# Patient Record
Sex: Female | Born: 1970 | Race: White | Hispanic: No | Marital: Single | Smoking: Current every day smoker
Health system: Southern US, Community
[De-identification: ages and names within clinical notes are randomized; demographics above are authoritative.]

## PROBLEM LIST (undated history)

## (undated) DIAGNOSIS — D689 Coagulation defect, unspecified: Secondary | ICD-10-CM

## (undated) DIAGNOSIS — I2699 Other pulmonary embolism without acute cor pulmonale: Secondary | ICD-10-CM

---

## 1998-12-23 ENCOUNTER — Emergency Department (HOSPITAL_COMMUNITY): Admission: EM | Admit: 1998-12-23 | Discharge: 1998-12-23 | Payer: Self-pay | Admitting: Emergency Medicine

## 2000-01-25 ENCOUNTER — Emergency Department (HOSPITAL_COMMUNITY): Admission: EM | Admit: 2000-01-25 | Discharge: 2000-01-25 | Payer: Self-pay | Admitting: Emergency Medicine

## 2002-10-14 ENCOUNTER — Encounter: Payer: Self-pay | Admitting: Emergency Medicine

## 2002-10-14 ENCOUNTER — Emergency Department (HOSPITAL_COMMUNITY): Admission: AD | Admit: 2002-10-14 | Discharge: 2002-10-14 | Payer: Self-pay | Admitting: Emergency Medicine

## 2004-01-19 ENCOUNTER — Emergency Department (HOSPITAL_COMMUNITY): Admission: EM | Admit: 2004-01-19 | Discharge: 2004-01-19 | Payer: Self-pay | Admitting: Emergency Medicine

## 2004-05-15 ENCOUNTER — Encounter
Admission: RE | Admit: 2004-05-15 | Discharge: 2004-05-15 | Payer: Self-pay | Admitting: Physical Medicine and Rehabilitation

## 2004-08-18 ENCOUNTER — Emergency Department (HOSPITAL_COMMUNITY): Admission: EM | Admit: 2004-08-18 | Discharge: 2004-08-18 | Payer: Self-pay | Admitting: Emergency Medicine

## 2006-08-30 ENCOUNTER — Inpatient Hospital Stay (HOSPITAL_COMMUNITY): Admission: EM | Admit: 2006-08-30 | Discharge: 2006-09-01 | Payer: Self-pay | Admitting: Emergency Medicine

## 2006-08-30 ENCOUNTER — Ambulatory Visit: Payer: Self-pay | Admitting: Cardiology

## 2006-08-31 ENCOUNTER — Encounter: Payer: Self-pay | Admitting: Cardiology

## 2006-10-29 ENCOUNTER — Emergency Department (HOSPITAL_COMMUNITY): Admission: EM | Admit: 2006-10-29 | Discharge: 2006-10-29 | Payer: Self-pay | Admitting: Emergency Medicine

## 2010-04-30 ENCOUNTER — Ambulatory Visit: Payer: Self-pay | Admitting: Internal Medicine

## 2010-05-29 ENCOUNTER — Inpatient Hospital Stay: Payer: Self-pay | Admitting: Internal Medicine

## 2010-05-31 ENCOUNTER — Ambulatory Visit: Payer: Self-pay | Admitting: Internal Medicine

## 2010-06-08 LAB — PATHOLOGY REPORT

## 2010-07-01 ENCOUNTER — Ambulatory Visit: Payer: Self-pay | Admitting: Internal Medicine

## 2010-07-24 ENCOUNTER — Ambulatory Visit: Payer: Self-pay | Admitting: Surgery

## 2010-10-16 NOTE — H&P (Signed)
NAMENERY, KALISZ NO.:  [id number]   MEDICAL RECORD NO.:  [id number]          PATIENT TYPE:  EMS   LOCATION:  MAJO                         FACILITY:  [facility]   PHYSICIAN:  Elliot Cousin, M.D.    DATE OF BIRTH:  [birth year]   DATE OF ADMISSION:  08/30/2006  DATE OF DISCHARGE:                              HISTORY & PHYSICAL   PRIMARY CARE PHYSICIAN:  Gentry Fitz (receives health care at [facility] in [place], [place]).   CHIEF COMPLAINT:  Chest pressure.   HISTORY OF PRESENT ILLNESS:  The patient is a 40 year old woman with a  past medical history significant for obesity, hypertension, and cervical  cancer, who presents to the emergency department with a chief complaint  of chest pain. The patient is an LPN. While she was at work today at  approximately 3 p.m., she experienced left sided chest pain.  She was  sitting with a patient at the time. The pain is described as a  pressure.  The pressure is sometimes associated with a fluttering  feeling in her chest. At the time that the pain occurred, she rated it  a 6/10 in intensity. It was associated with transient shortness of  breath, mild diaphoresis, and light headedness.  She had no radiation of  the pressure and no associated nausea. She asked one of her colleagues  at work to check her blood pressure. Her blood pressure was recorded as  200/150.  It was repeated several minutes later at 187/118. The patient  is chronically treated with Micardis-HCT for hypertension. She has had  mild chest pain in the recent past which generally lasts for  approximately a minute or two and then goes away.  At times in the past,  she has had some radiation to the left neck. 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   During the evaluation in the emergency department, the patient is found  to be mildly hypertensive, otherwise hemodynamically  stable. Her blood  pressure on arrival to the emergency department was 175/67 and it is now  133/47. The patient is currently chest pain free after approximately 45  minutes of chest pressure.  She received 4 baby aspirin in the emergency  department prior to the complete resolution of her chest pain.  Her EKG  reveals a heart rate of 92 beats per minute and no significant  abnormalities.  Her cardiac markers are negative. Her D-dimer is within  normal limits at 0.04. The patient has a significant family history of  coronary artery disease and will therefore be admitted for further  evaluation and management.   PAST MEDICAL HISTORY:  1. Hypertension.  2. Gastroesophageal reflux disease.  3. History of cervical cancer status post conization in 1995.  4. Abnormal menstrual periods. The patient states that she has a      menstrual period every 3-4 months and when she has a menstrual      period it usually lasts approximately 25 days. She has an      appointment pending with her gynecologist.  5. Fatty liver per ultrasound of the abdomen in March of 2008 in      [place], [place]. The ultrasound was obtained for evaluation of      abdominal pain.  6. Obesity.  7. 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  8. Status post tonsillectomy in the past.   MEDICATIONS:  1. Micardis-HCT 40/12.5 mg daily.  2. Prilosec 20 mg daily.  3. Unisom 1 tablet p.r.n. for insomnia.  4. Tylenol 325 mg p.r.n.  5. Motrin 200-400 mg p.r.n.   ALLERGIES:  FLUORIDE, HYDROCODONE, AND SEPTRA.   SOCIAL HISTORY:  The patient is single. She has one child. She is  employed as an Public house manager. She lives in [place], [place]. She denies tobacco  use. She quite smoking 3 years ago after smoking nearly 25 years. She  drinks alcohol on occasion.  No illicit drugs.   FAMILY HISTORY:  Her mother is 87-years of age and had a heart attack at  83 and 22 years of age.  Her mother also has a history of two strokes.  Her  mother had ovarian cancer at 71 years of age. The health of her  father is unknown.  She has a 42 year old sister who is healthy (her  sister has a different father).   REVIEW OF SYSTEMS:  The patient's review of systems is positive for  occasional fluttering in her chest, occasional chest pain, occasional  shortness of breath, neck pain, heart burn.  Otherwise review of systems  is negative.   PHYSICAL EXAMINATION:  VITAL SIGNS: Temperature 98.6, blood pressure  133/47, pulse 78, respiratory rate 20, oxygen saturation 99% on room  air.  GENERAL:  The patient is a pleasant, obese, 40 year old Caucasian woman  who is currently sitting up in bed in no acute distress.  HEENT:  Head is normocephalic, atraumatic.  Pupils are equal, round,  reactive to light. Extraocular movements are intact. Conjunctivae are  clear, sclerae are white. Nasal mucosa is dry. No sinus tenderness.  Oropharynx reveals good dentition. Mucous membranes are moist. No  posterior exudates or erythema.  NECK:  Supple, no adenopathy, no thyromegaly, no bruit, no JVD. No  tenderness.  HEART:  S1, S2 with a 2/6 systolic murmur.  LUNGS:  Clear to auscultation bilaterally.  ABDOMEN:  Obese, positive bowel sounds, soft, nontender, nondistended.  No hepatosplenomegaly, no masses palpated.  EXTREMITIES:  Pedal pulses 2+ bilaterally. No pretibial edema and no  pedal edema.  NEUROLOGIC:  The patient is alert and oriented x3. Cranial nerves II-XII  are intact. Strength is 5/5 throughout. Sensation is intact.  SKIN:  The patient has multiple tattoos, one on each arm, two on her  chest and one on each leg.   ADMISSION LABORATORIES:  Chest x-ray reveals no acute findings.  Myoglobin 37.9. CK-MB 1.0. Troponin I less than 0.05. WBC 8.2 thousand,  hemoglobin 14.4, hematocrit 42.1, platelets 221,000.  Sodium 136,  potassium 3.8, chloride 108, BUN 14, glucose 133, bicarbonate 22, creatinine 0.7. D-dimer 0.4.   ASSESSMENT:  1.  Chest pain. Given the patient's significant family history and her      personal history of hypertension and obesity, myocardial infarction      will need to be ruled out.  The patient will also be evaluated for      mitral valve prolapse and thyroid disease.  2. Hyperglycemia. The patient's glucose is 133. The glucose was non      fasting. However the patient  is at risk for developing type  2      diabetes mellitus.  3. Hypertension. The patient's blood pressure is now within normal      limits. She is chronically treated with Micardis-HCT.   PLAN:  1. The patient will be admitted for further evaluation and management.  2. Will check cardiac enzymes q.8 hours x3.  3. For further evaluation will check a 2D echocardiogram  to evaluate      for mitral valve prolapse.  4. Will also assess the patient's TSH and fasting lipid panel.  5. Will check a hemoglobin A1C.  6. Nutrition consult for recommendations regarding weight loss.  7. Supportive care and pain management. Will start a baby aspirin      daily for now.      Elliot Cousin, M.D.  Electronically Signed     DF/MEDQ  D:  08/30/2006  T:  08/30/2006  Job:  [id number]

## 2010-10-16 NOTE — Consult Note (Signed)
NAMEEKAM, BONEBRAKE          ACCOUNT NO.:  [id number]   MEDICAL RECORD NO.:  [id number]          PATIENT TYPE:  INP   LOCATION:  3707                         FACILITY:  [facility]   PHYSICIAN:  Bevelyn Buckles. Bensimhon, MDDATE OF BIRTH:  July 23, 1970   DATE OF CONSULTATION:  08/31/2006  DATE OF DISCHARGE:                                 CONSULTATION   REFERRING PHYSICIAN:  [facility].   PRIMARY CARE PHYSICIAN:  [facility].   SUMMARY OF HISTORY:  Ms. Ohagan is a 40 year old white female who was  admitted by [facility] through [facility] emergency room when she  presented with chest discomfort via private vehicle.  We are asked to  evaluate given her multiple cardiac risk factors and her discomfort.   Ms. Isakson describes anterior chest pressure since January 2008.  The  discomfort does not radiate.  She states that this occurs two to three  times a month, the duration of the discomfort is less than 5 minutes.  It does not radiate, nor is it associated with palpitations, nausea or  vomiting.  She has associated fluttering and some shortness of breath.  She is not sure about diaphoresis because she states that she has hot  flashes all the time.  With these episodes she will relax and try to  meditate.  She is not sure if this helps or not but the discomfort is  gone within 5 minutes.  She is not aware of any alleviating or  aggravating factors.  She has never checked her blood pressure during  these episodes.   At work yesterday afternoon at approximately 3:15 p.m. she stated that  she felt terrible.  She first developed a headache, noticed a heart beat  in her ears followed by shortness of breath and chest pressure  fluttering sensation which she gave a 6 on a scale of 0/10. Her blood  pressure she took was 200/150.  She recheck this and it was 220/140.  She rechecked this again and it was 181/118 with a pulse of 94.  Because  of her  symptoms and her hypertension she presented to the emergency room  for evaluation.  Since admission she has continued to have some  intermittent chest pressure.   ALLERGIES:  Include FLUORIDE, HYDROCODONE and SEPTRA.   MEDICATIONS PRIOR TO ADMISSION:  Include Micardis/HCT 40/12.5 every day,  Prilosec 20 daily,  Unisom p.r.n., Tylenol p.r.n., Motrin 200 to 400 mg  p.r.n.   PAST MEDICAL HISTORY:  Notable for obesity, hypertension of unspecified  duration.  Her blood pressure at home she states usually runs from the  140s over 70s to 80s.  She also has a history of cervical cancer status  post conization in 1995, irregular menses which occur every 3-4 months  lasting 25 days.  She has DJD in the cervical spine, fatty liver  determined by ultrasound in 3/08, unipolar disorder with anxiety since  1997, status post tonsillectomy and adenoidectomy, status post wisdom  teeth removal in 1994.   SOCIAL HISTORY:  She resides in [place] with her 34 year old son  and her  59 year old mother.  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  She is single.  She has not smoked in 3 years. Prior to that she smoked one and a half  packs per day for 25 years.  She may have a beer one to two times per  month, denies any illicit drugs.  She stopped using flaxseed oil  approximately 3 months ago.  She states that she maintains a low-fat  diet and avoids processed food, however she tends to binge on sweets.  She does not exercise.   FAMILY HISTORY:  Mother is age 12 with a history of myocardial  infarction at the age of 39 and 50, a history of strokes x2, ovarian  cancer at the age of 42.  She does not know her father's health.  She  has a half sister that is alive and well.   REVIEW OF SYSTEMS:  In addition to above is notable for coughing,  wheezing last week associated with a temperature of 101.8 of which she  did not seek evaluation.   Chronic hot flashes.  A 25-pound weight gain  over the last year.  Occasional headaches, glasses, occasional dyspnea  on exertion.  Last menstruation cycle was from 06/11/2006 to 07/02/2006.  She has chronic anxiety, neck arthralgias and in her left shoulder.  GERD symptoms.  She does snore.  Her primary care physician is  recommending a sleep study and she also has an appointment to see her  OB/GYN.   PHYSICAL EXAM:  GENERAL:  Well-nourished, well-developed pleasant obese  white female in no apparent distress.  VITAL SIGNS:  Temperature is 97.9, blood pressure is 127/73, pulse 70  regular, respirations 18 and regular, T max of 98.7.  Telemetry shows  normal sinus rhythm without ectopy.  Admission weight 131 kg.  Sat is  99% on room air.  HEENT:  Is unremarkable.  NECK:  Supple without thyromegaly, adenopathy, JVD or carotid bruits.  CHEST:  Symmetrical excursion.  LUNGS:  Lungs were clear to auscultation without rales, rhonchi or  wheezing.  HEART:  PMI is not displaced.  Regular rate and rhythm.  Normal S1 and  S2.  I did not appreciate murmurs, rubs, clicks or gallops.  All pulses  are symmetrical and intact without abdominal or femoral bruits.  SKIN:  Integument is intact.  She has multiple tattoos and dark velvety  skin over her joint folds.  ABDOMEN:  Obese.  Bowel sounds present without organomegaly, masses or  tenderness.  EXTREMITIES:  Negative cyanosis, clubbing or edema.  MUSCULOSKELETAL AND NEUROLOGIC:  Grossly unremarkable.   Chest x-ray showed no active disease.  EKG x2 showed normal sinus  rhythm, normal axis, normal intervals, early R-wave progression.  No old  EKGs for comparison.  On admission H&H was 15.0 and 44.0.  Normal  indices.  Platelets 221, WBCs 8.2.  CK MBs and troponins are within  normal limits x2.  D-dimer 0.4, hemoglobin A1c 5.6.  Today a C-met  showed sodium 142, potassium 3.5, BUN 12, creatinine 0.63, glucose 100, ALT was slightly elevated at 42.   Fasting lipids showed a cholesterol  192, triglycerides 132, HDL 29, LDL 137.   IMPRESSION:  Atypical chest discomfort, hypertension, multiple cardiac  risk factors including hypertension, obesity, hyperlipidemia, early  family history, remote tobacco use.   DISPOSITION:  Dr. Gala Romney reviewed the patient's history, spoke with  and examined the patient and agrees with the above.  He felt that her  discomfort was probably not cardiac given her normal EKGs, history and  negative cardiac enzymes.  It was felt that she would be low risk.  It  was felt that she could be discharged today if her echocardiogram was  normal.  However, we will set her up for an outpatient Myoview to  further evaluate her multiple cardiac risk factors and would add Coreg  6.25 mg b.i.d. to her medical regimen for her hypertension.  Would consider outpatient 24-hour urine to rule out pheochromocytoma  checking her urine catecholamines, VMA and metanephrine.  She should  also be referred to a weight-loss program as well as obtain and  establish a blood pressure diary and follow up with her primary care  physician.      Joellyn Rued, PA-C      Bevelyn Buckles. Bensimhon, MD  Electronically Signed    EW/MEDQ  D:  08/31/2006  T:  08/31/2006  Job:  [id number]   cc:   [facility] R. Bensimhon, MD

## 2011-08-19 IMAGING — CT NM PET TUM IMG LTD AREA
1 of 5 series · 8 of 25 positions shown · non-contrast
Comparison: none

REASON FOR EXAM: SPLENIC MASSES AND ABDOMINAL PAIN
COMMENTS:

[Series 3: ct wb 3.0 b30f · axial · 3.0mm · 0.98mm/px · z∈[-1263,-589]mm · 8 of 435 slices shown]
[im 49/435  soft-tissue]
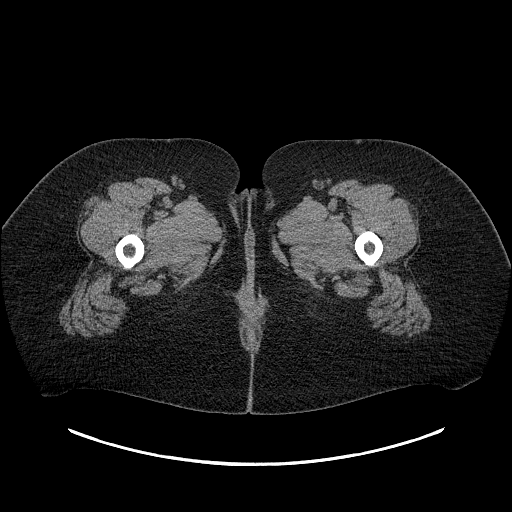
[im 97/435  soft-tissue]
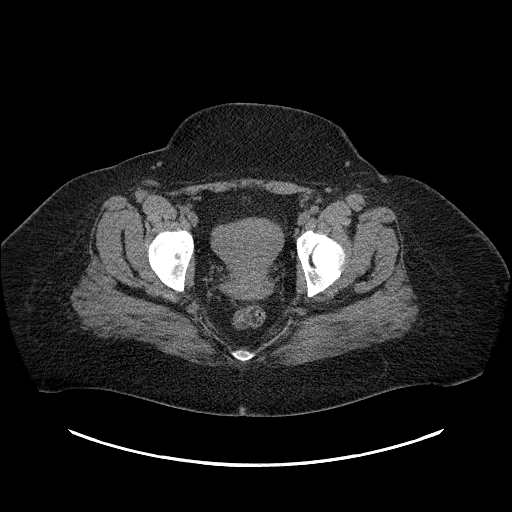
[im 145/435  soft-tissue]
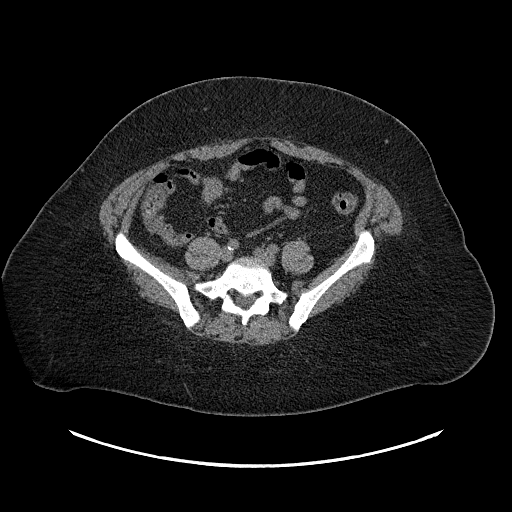
[im 193/435  soft-tissue]
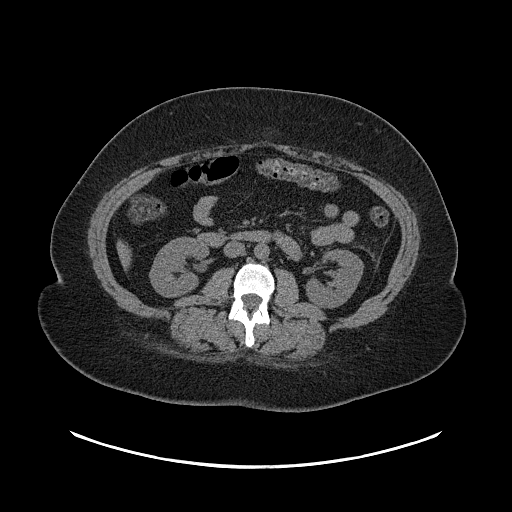
[im 242/435  soft-tissue]
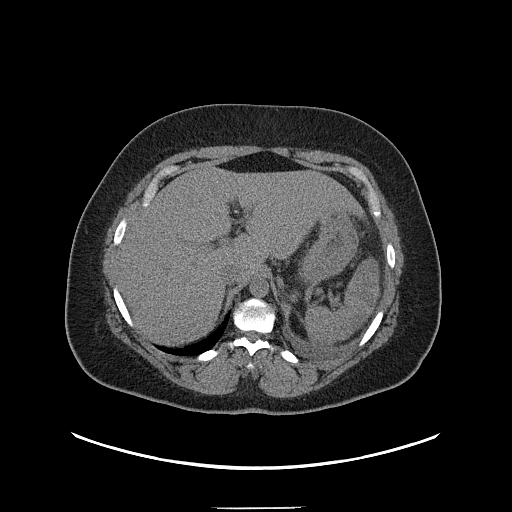
[im 290/435  soft-tissue]
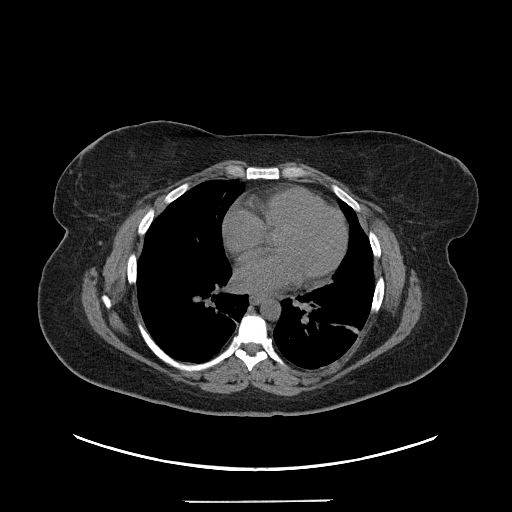
[im 338/435  soft-tissue]
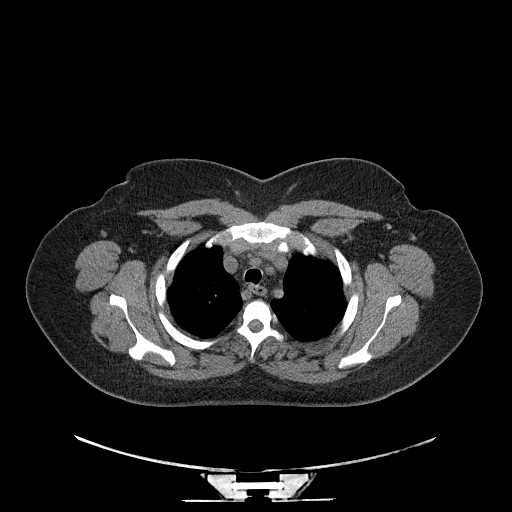
[im 386/435  soft-tissue]
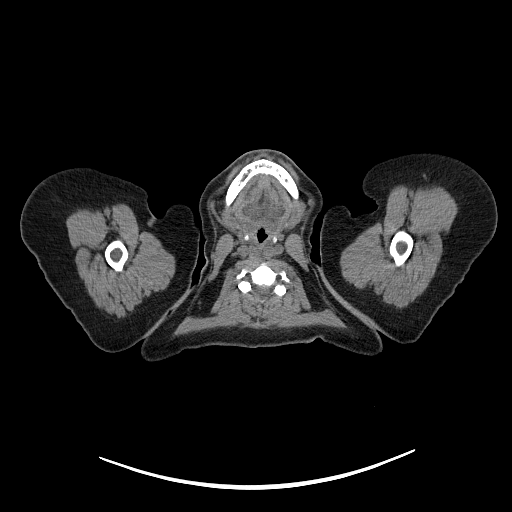

[8 of 25 positions shown; findings below may reference images not displayed]

PROCEDURE:     PET - PET/CT DX LYMPHOMA  - June 03, 2010 [DATE]

RESULT:     The patient's fasting blood glucose level is measured at 121
mg/dL. The patient received an injection of 13.69 mCi of fluorine 18 labeled
fluorodeoxyglucose in the left wrist at [DATE] a.m. Imaging is performed from
the base of the brain into the thighs between the hours of [DATE] and [DATE]
a.m. There is no previous study for comparison. Noncontrast CT is performed
over the same regions as the PET acquisition for the purposes of attenuation
correction and fusion. The noncontrast CT, attenuation corrected PET images
and fused PET/CT data are reconstructed in the axial, coronal and sagittal
planes by the [employer] software. A rotating three-dimensional MIP
projection was created.

There is an air calcification in the mid right kidney consistent with a
large nonobstructing right renal calculus measuring as much as 10 mm
diameter. The spleen is not enlarged. No abnormal accumulation of F-18 FDG
is observed in the spleen or elsewhere. There is some stranding adjacent to
the spleen. Has there been previous trauma to this area? No free fluid or
other abnormal attenuation is present. No adenopathy is evident. The kidneys
show no obstruction. Some linear density in the left lung base which is
likely fibrotic or atelectatic in origin.
IMPRESSION: No abnormal accumulation of F-18 FDG demonstrated. There is an abnormal
appearance of the spleen with a relative area of photopenia along the
inferior anterior aspect which could represent fluid. Correlate clinically.

## 2015-05-17 ENCOUNTER — Emergency Department: Payer: BLUE CROSS/BLUE SHIELD

## 2015-05-17 ENCOUNTER — Emergency Department
Admission: EM | Admit: 2015-05-17 | Discharge: 2015-05-17 | Disposition: A | Discharge status: Home / Self Care | Payer: BLUE CROSS/BLUE SHIELD | Attending: Student | Admitting: Student

## 2015-05-17 ENCOUNTER — Encounter: Payer: Self-pay | Admitting: *Deleted

## 2015-05-17 DIAGNOSIS — D689 Coagulation defect, unspecified: Secondary | ICD-10-CM | POA: Diagnosis not present

## 2015-05-17 DIAGNOSIS — Y998 Other external cause status: Secondary | ICD-10-CM | POA: Diagnosis not present

## 2015-05-17 DIAGNOSIS — Z880 Allergy status to penicillin: Secondary | ICD-10-CM | POA: Diagnosis not present

## 2015-05-17 DIAGNOSIS — E669 Obesity, unspecified: Secondary | ICD-10-CM | POA: Diagnosis not present

## 2015-05-17 DIAGNOSIS — S53401A Unspecified sprain of right elbow, initial encounter: Secondary | ICD-10-CM | POA: Diagnosis not present

## 2015-05-17 DIAGNOSIS — Z91040 Latex allergy status: Secondary | ICD-10-CM | POA: Insufficient documentation

## 2015-05-17 DIAGNOSIS — Y9389 Activity, other specified: Secondary | ICD-10-CM | POA: Insufficient documentation

## 2015-05-17 DIAGNOSIS — W010XXA Fall on same level from slipping, tripping and stumbling without subsequent striking against object, initial encounter: Secondary | ICD-10-CM | POA: Diagnosis not present

## 2015-05-17 DIAGNOSIS — Y9289 Other specified places as the place of occurrence of the external cause: Secondary | ICD-10-CM | POA: Diagnosis not present

## 2015-05-17 DIAGNOSIS — S59901A Unspecified injury of right elbow, initial encounter: Secondary | ICD-10-CM | POA: Diagnosis present

## 2015-05-17 DIAGNOSIS — S63501A Unspecified sprain of right wrist, initial encounter: Secondary | ICD-10-CM | POA: Diagnosis not present

## 2015-05-17 DIAGNOSIS — F17200 Nicotine dependence, unspecified, uncomplicated: Secondary | ICD-10-CM | POA: Insufficient documentation

## 2015-05-17 HISTORY — DX: Coagulation defect, unspecified: D68.9

## 2015-05-17 HISTORY — DX: Other pulmonary embolism without acute cor pulmonale: I26.99

## 2015-05-17 MED ORDER — OXYCODONE-ACETAMINOPHEN 5-325 MG PO TABS: 1.0000 | ORAL_TABLET | Freq: Four times a day (QID) | ORAL | Status: AC | PRN

## 2015-05-17 NOTE — ED Provider Notes (Signed)
[facility] Emergency Department Provider Note  ____________________________________________  Time seen: Approximately 2:17 PM  I have reviewed the triage vital signs and the nursing notes.   HISTORY  Chief Complaint Arm Pain    HPI Krista Hawkins is a 44 y.o. female patient complain of pain to the right elbow and wrist secondary to a slip and fall today. Patient rating the pain as a 6/10. Patient stated pain increases with pronationand supination of the right elbow. Patient state pain also increases with flexion and extension of the right wrist. No palliative measures taken for this complaint.   Past Medical History  Diagnosis Date  . Pulmonary emboli (HCC)   . Blood clotting disorder (HCC)     There are no active problems to display for this patient.   History reviewed. No pertinent past surgical history.  No current outpatient prescriptions on file.  Allergies Heparin; Hydrocodone; Latex; Levaquin; Other; and Penicillins  History reviewed. No pertinent family history.  Social History Social History  Substance Use Topics  . Smoking status: Current Every Day Smoker  . Smokeless tobacco: None  . Alcohol Use: None    Review of Systems Constitutional: No fever/chills Eyes: No visual changes. ENT: No sore throat. Cardiovascular: Denies chest pain. Respiratory: Denies shortness of breath. Gastrointestinal: No abdominal pain.  No nausea, no vomiting.  No diarrhea.  No constipation. Genitourinary: Negative for dysuria. Musculoskeletal: Right elbow and right wrist pain Skin: Negative for rash. Neurological: Negative for headaches, focal weakness or numbness. Hematological/Lymphatic:Blood clot disorder Allergic/Immunilogical: The allergy list  10-point ROS otherwise negative.  ____________________________________________   PHYSICAL EXAM:  VITAL SIGNS: ED Triage Vitals  Enc Vitals Group     BP 05/17/15 1320 152/84 mmHg     Pulse  Rate 05/17/15 1320 91     Resp 05/17/15 1320 18     Temp 05/17/15 1320 98.7 F (37.1 C)     Temp Source 05/17/15 1320 Oral     SpO2 05/17/15 1320 98 %     Weight 05/17/15 1320 231 lb (104.781 kg)     Height 05/17/15 1320 5\' 1"  (1.549 m)     Head Cir --      Peak Flow --      Pain Score 05/17/15 1321 6     Pain Loc --      Pain Edu? --      Excl. in GC? --     Constitutional: Alert and oriented. Well appearing and in no acute distress. Obesity Eyes: Conjunctivae are normal. PERRL. EOMI. Head: Atraumatic. Nose: No congestion/rhinnorhea. Mouth/Throat: Mucous membranes are moist.  Oropharynx non-erythematous. Neck: No stridor.  No cervical spine tenderness to palpation. Hematological/Lymphatic/Immunilogical: No cervical lymphadenopathy. Cardiovascular: Normal rate, regular rhythm. Grossly normal heart sounds.  Good peripheral circulation. Mild elevation of blood pressure Respiratory: Normal respiratory effort.  No retractions. Lungs CTAB. Gastrointestinal: Soft and nontender. No distention. No abdominal bruits. No CVA tenderness. Musculoskeletal: No obvious deformity of the right elbow or wrist. There is no abrasion or ecchymosis. Patient's tender palpation olecranon process and also at the distal. Neurologic:  Normal speech and language. No gross focal neurologic deficits are appreciated. No gait instability. Skin:  Skin is warm, dry and intact. No rash noted. Psychiatric: Mood and affect are normal. Speech and behavior are normal.  ____________________________________________   LABS (all labs ordered are listed, but only abnormal results are displayed)  Labs Reviewed - No data to display ____________________________________________  EKG   ____________________________________________  RADIOLOGY  Cathlean SauerX-ray  of the right elbow and wrist were grossly unremarkable. I, Joni Reining, personally viewed and evaluated these images (plain radiographs) as part of my medical decision  making.   ____________________________________________   PROCEDURES  Procedure(s) performed: None  Critical Care performed: No  ____________________________________________   INITIAL IMPRESSION / ASSESSMENT AND PLAN / ED COURSE  Pertinent labs & imaging results that were available during my care of the patient were reviewed by me and considered in my medical decision making (see chart for details).  Sprain right elbow and wrist.. Discussed x-ray findings with patient. Patient placed in a Velcro wrist splint and given a sling. He stated he was discharged care instructions and a prescription for Percocets. Patient given a work note for 2 days. Patient advised follow-up PCP if condition persists. ____________________________________________   FINAL CLINICAL IMPRESSION(S) / ED DIAGNOSES  Final diagnoses:  Elbow sprain, right, initial encounter  Sprain of right wrist, initial encounter      Joni Reining, PA-C 05/17/15 1454  Gayla Doss, MD 05/17/15 1537

## 2015-05-17 NOTE — ED Notes (Addendum)
States she slipped on the ice this AM and now had right arm pain, elbow and wrist, arm dry and warm, pulses papable, pt on coumadin, denies hitting her head

## 2015-05-17 NOTE — Discharge Instructions (Signed)
Wear splint and sling for 2-3 days as needed.

## 2016-08-01 IMAGING — CR DG WRIST COMPLETE 3+V*R*
1 series · 4 of 4 positions shown · non-contrast
Comparison: None.

CLINICAL DATA: 44-year-old female with acute right wrist pain
following fall today. Initial encounter.

EXAM:
RIGHT WRIST - COMPLETE 3+ VIEW

[Series 1: dg wrist complete right · 0.14mm/px · 4 of 4 slices shown]
[im 1/4]
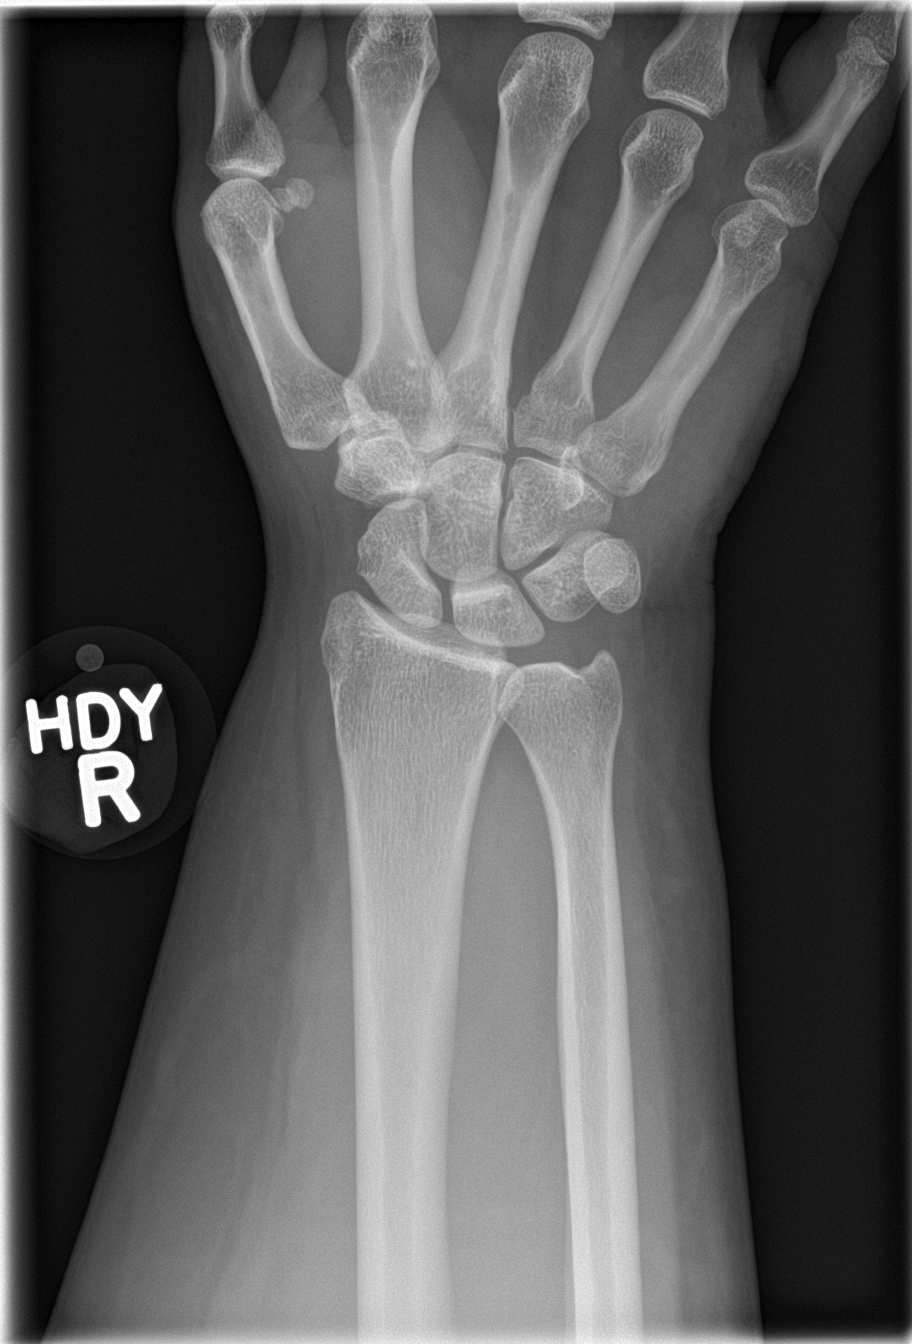
[im 2/4]
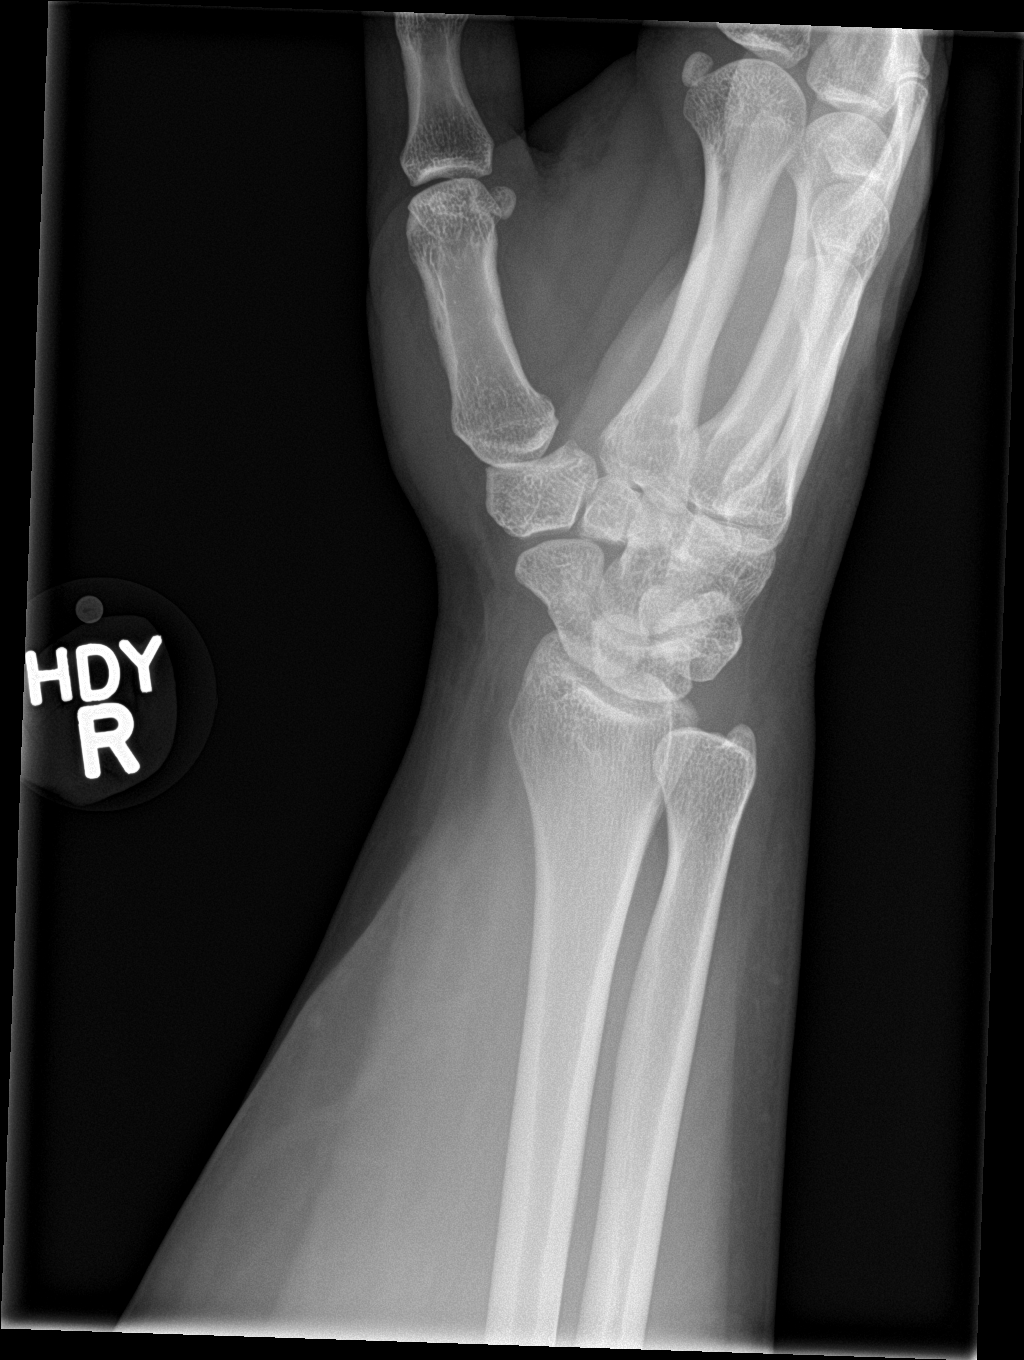
[im 3/4]
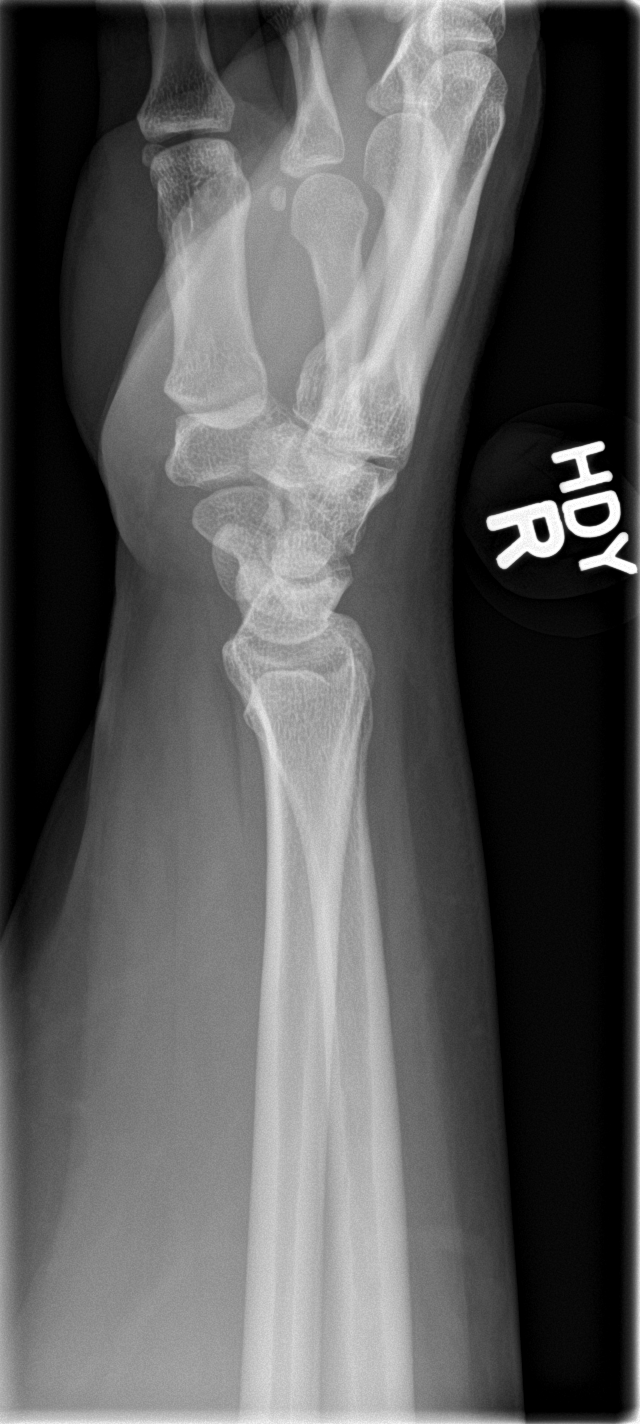
[im 4/4]
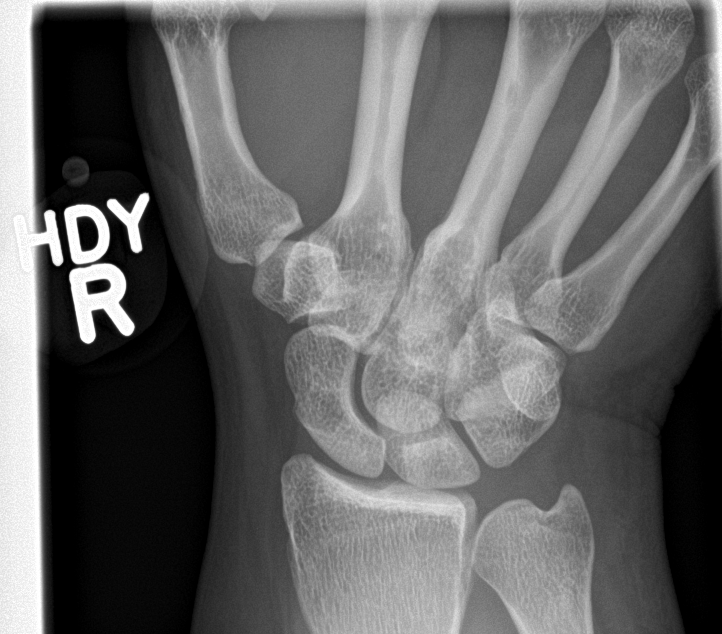

[4 of 4 positions shown; findings below may reference images not displayed]

FINDINGS: There is no evidence of fracture or dislocation. There is no
evidence of arthropathy or other focal bone abnormality. Soft
tissues are unremarkable.
IMPRESSION: Negative.
